# Patient Record
Sex: Male | Born: 1985 | Race: Black or African American | Hispanic: No | Marital: Married | Smoking: Never smoker
Health system: Southern US, Community
[De-identification: ages and names within clinical notes are randomized; demographics above are authoritative.]

## PROBLEM LIST (undated history)

## (undated) DIAGNOSIS — J45909 Unspecified asthma, uncomplicated: Secondary | ICD-10-CM

## (undated) HISTORY — PX: OTHER SURGICAL HISTORY: SHX169

## (undated) HISTORY — DX: Unspecified asthma, uncomplicated: J45.909

---

## 2012-07-23 ENCOUNTER — Encounter: Payer: Self-pay | Admitting: Internal Medicine

## 2012-07-27 ENCOUNTER — Ambulatory Visit (INDEPENDENT_AMBULATORY_CARE_PROVIDER_SITE_OTHER): Payer: BC Managed Care – PPO | Admitting: Internal Medicine

## 2012-07-27 ENCOUNTER — Encounter: Payer: Self-pay | Admitting: Internal Medicine

## 2012-07-27 VITALS — BP 160/60 | HR 80 | Ht 74.0 in | Wt 176.6 lb

## 2012-07-27 DIAGNOSIS — R634 Abnormal weight loss: Secondary | ICD-10-CM

## 2012-07-27 DIAGNOSIS — R143 Flatulence: Secondary | ICD-10-CM

## 2012-07-27 DIAGNOSIS — R197 Diarrhea, unspecified: Secondary | ICD-10-CM

## 2012-07-27 DIAGNOSIS — R198 Other specified symptoms and signs involving the digestive system and abdomen: Secondary | ICD-10-CM

## 2012-07-27 DIAGNOSIS — R141 Gas pain: Secondary | ICD-10-CM

## 2012-07-27 MED ORDER — METRONIDAZOLE 250 MG PO TABS: 250.0000 mg | ORAL_TABLET | Freq: Four times a day (QID) | ORAL | Status: AC

## 2012-07-27 NOTE — Progress Notes (Signed)
HISTORY OF PRESENT ILLNESS:  Isaac Morgan is a 26 y.o. male if no significant past medical history. He is sent today regarding abdominal complaints, change in bowel habits, and weight loss. Patient states that he was in his usual state of good health until November of 2012. At that time, he began to notice awakening approximately 4 to 6 AM with an upset stomach and the need to defecate. He would describe his bowels as more loose than normal, but not diarrhea. Thereafter he would have a "hollow feeling" which would linger until about noon. Remainder of the day, he felt well. Occasionally he would notice in need to move his bowels after certain meals. Typically moves his bowels once or twice daily, in the morning. He denies bleeding or steatorrhea. Since November, he reports 19 pound weight loss. He has had significant bloating. No nausea, vomiting, reflux symptoms, or urgency. Review of outside records shows laboratories from 04/16/2012 including normal comprehensive metabolic panel, lipids, thyroid studies, and CBC (except for white blood cell count of 2.5). Also, normal urinalysis. Repeat laboratories were performed 07/09/2012. Comprehensive metabolic panel and lipids were again unremarkable. CBC revealed a white blood cell count of 2.4, but was otherwise normal. Sedimentation rate, TSH, testing for celiac sprue, and Helicobacter pylori antibody were normal. He was placed on the probiotic Align last week. For the first 2 days, he states he felt better. However, symptoms have returned since. He denies change in his diet, exotic travel, over-the-counter supplements, or family history of GI problems such as inflammatory bowel disease. He works in Nurse, adult at [employer].  REVIEW OF SYSTEMS:  All non-GI ROS negative   Past Medical History  Diagnosis Date  . Asthma     For 15 years    Past Surgical History  Procedure Date  . None     Social History Mahlon Gabrielle  reports that he has  never smoked. He has never used smokeless tobacco. He reports that he does not drink alcohol or use illicit drugs.  family history includes Liver cancer in an unspecified family member.  No Known Allergies     PHYSICAL EXAMINATION: Vital signs: BP 160/60  Pulse 80  Ht 6\' 2"  (1.88 m)  Wt 176 lb 9.6 oz (80.105 kg)  BMI 22.67 kg/m2  Constitutional: generally well-appearing, no acute distress Psychiatric: alert and oriented x3, cooperative Eyes: extraocular movements intact, anicteric, conjunctiva pink Mouth: oral pharynx moist, no lesions Neck: supple no lymphadenopathy Cardiovascular: heart regular rate and rhythm, no murmur Lungs: clear to auscultation bilaterally Abdomen: soft, nontender, nondistended, no obvious ascites, no peritoneal signs, normal bowel sounds, no organomegaly Rectal:Deferred Extremities: no lower extremity edema bilaterally Skin: no lesions on visible extremities Neuro: No focal deficits. No asterixis.     ASSESSMENT:  #1. Previously healthy 26 year old with a 9 month history of change in bowel habits,and weight loss. Laboratories unrevealing. Relevance of stable leukopenia unclear. I am concerned about an underlying GI process, given the fact that the patient is awoken by symptoms and he has had weight loss. Aside from that, not a lot of additional worrisome features such as additional problems at other times of the day, bleeding, or laboratory perturbation. He could have something like Giardia.   PLAN:  #1. Empiric trial of metronidazole 250 mg by mouth 4 times a day x10 days. Effects and side effects discussed #2. Patient has been instructed to contact the office in 2-3 weeks with an update. If he is feeling better, then followup in  a few months. However, if he is no better, then we will proceed with colonoscopy and upper endoscopy with biopsies.The nature of the procedure, as well as the risks, benefits, and alternatives were carefully and thoroughly  reviewed with the patient. Ample time for discussion and questions allowed. The patient understood, was satisfied, and agreed to proceed.

## 2012-07-27 NOTE — Patient Instructions (Addendum)
We have sent the following medications to your pharmacy for you to pick up at your convenience:  Metronidazole   Please call our office in 2-3 weeks to let us know how you are doing

## 2012-11-10 ENCOUNTER — Other Ambulatory Visit: Payer: Self-pay | Admitting: Emergency Medicine

## 2012-11-10 ENCOUNTER — Ambulatory Visit
Admission: RE | Admit: 2012-11-10 | Discharge: 2012-11-10 | Disposition: A | Discharge status: Home / Self Care | Payer: BC Managed Care – PPO | Source: Ambulatory Visit | Attending: Emergency Medicine | Admitting: Emergency Medicine

## 2012-11-10 DIAGNOSIS — R0602 Shortness of breath: Secondary | ICD-10-CM

## 2012-11-17 ENCOUNTER — Telehealth: Payer: Self-pay | Admitting: Internal Medicine

## 2012-11-17 ENCOUNTER — Ambulatory Visit: Payer: BC Managed Care – PPO | Admitting: Internal Medicine

## 2012-11-17 NOTE — Telephone Encounter (Signed)
pt called to r/s appt today, something came up for work. Will you charge?

## 2012-11-24 ENCOUNTER — Encounter: Payer: Self-pay | Admitting: Internal Medicine

## 2012-11-24 ENCOUNTER — Ambulatory Visit (INDEPENDENT_AMBULATORY_CARE_PROVIDER_SITE_OTHER): Payer: BC Managed Care – PPO | Admitting: Internal Medicine

## 2012-11-24 VITALS — BP 124/62 | HR 84 | Ht 74.0 in | Wt 178.2 lb

## 2012-11-24 DIAGNOSIS — R141 Gas pain: Secondary | ICD-10-CM

## 2012-11-24 DIAGNOSIS — R143 Flatulence: Secondary | ICD-10-CM

## 2012-11-24 DIAGNOSIS — R198 Other specified symptoms and signs involving the digestive system and abdomen: Secondary | ICD-10-CM

## 2012-11-24 NOTE — Progress Notes (Signed)
HISTORY OF PRESENT ILLNESS:  Isaac Morgan is a 26 y.o. male with no significant medical history who was evaluated in August 2013 for abdominal complaints, change in bowel habits, and weight loss. See that dictation for details. He was empirically treated with metronidazole 250 mg 4 times a day for 10 days. At completing antibiotic therapy he reports complete resolution of symptoms. No further problems with nocturnal, pain, urgency, or loose stools. He now reports all of bowel movements. He has gained a few pounds of weight. Some complaint today is that of occasional gas as manifested by flatus. Does notice that there products often, but not always, seem to be associated. He requires about other dietary contributors to gas. Otherwise he feels well and is pleased  REVIEW OF SYSTEMS:  All non-GI ROS negative   Past Medical History  Diagnosis Date  . Asthma     For 15 years    Past Surgical History  Procedure Date  . None     Social History Isaac Morgan  reports that he has never smoked. He has never used smokeless tobacco. He reports that he does not drink alcohol or use illicit drugs.  family history includes Liver cancer in an unspecified family member.  No Known Allergies     PHYSICAL EXAMINATION: Vital signs: BP 124/62  Pulse 84  Ht 6\' 2"  (1.88 m)  Wt 178 lb 3.2 oz (80.831 kg)  BMI 22.88 kg/m2 General: Well-developed, well-nourished, no acute distress HEENT: Sclerae are anicteric, conjunctiva pink. Oral mucosa intact Lungs: Clear Heart: Regular Abdomen: soft, nontender, nondistended, no obvious ascites, no peritoneal signs, normal bowel sounds. No organomegaly. Extremities: No edema Psychiatric: alert and oriented x3. Cooperative    ASSESSMENT:  #1. Previous problems with abdominal complaints, change in bowel habits, and weight loss resolved after a 10 day empiric course of metronidazole. I suspect he may have had giardiasis. #2. Gas in the form of  flatus   PLAN:  #1. Anti-gas and flatulence dietary sheet as well as informational literature on intestinal gas provided for his review #2. He may try Lactaid with dairy products to see if this helps #3. GI followup as needed

## 2012-11-24 NOTE — Patient Instructions (Addendum)
You may take Lactaid, which you can find over the counter, with dairy products.  We have given you some information on gas.  Please follow up with Dr. Marina Goodell as needed

## 2012-11-29 ENCOUNTER — Encounter: Payer: Self-pay | Admitting: Pulmonary Disease

## 2012-11-29 ENCOUNTER — Ambulatory Visit (INDEPENDENT_AMBULATORY_CARE_PROVIDER_SITE_OTHER): Payer: BC Managed Care – PPO | Admitting: Pulmonary Disease

## 2012-11-29 VITALS — BP 112/78 | HR 63 | Temp 97.4°F | Ht 74.0 in | Wt 180.2 lb

## 2012-11-29 DIAGNOSIS — R0609 Other forms of dyspnea: Secondary | ICD-10-CM

## 2012-11-29 NOTE — Patient Instructions (Addendum)
Continue on symbicort as you are doing for now. Will schedule you for breathing tests to evaluate your current symptoms within the next 2 weeks.  Would like to see you back same day to review. Will have the nurse call [facility] to get all of your recent labwork.

## 2012-11-29 NOTE — Progress Notes (Signed)
  Subjective:    Patient ID: Isaac Morgan, male    DOB: 1986-08-16, 26 y.o.   MRN: [id number]  HPI The patient is a 26 year old male who I've been asked to see for dyspnea with heavy exertional activities.  The patient has a history of asthma since childhood, but has only been on maintenance therapy for the last one year.  On top of that, he is only using symbicort once a day instead of twice.  He has noticed over the last 6 months increasing shortness of breath with heavier exertional activities.  He describes this with walking up multiple flights of stairs, or playing with his dog.  The patient works out at least 3 times a week, and states that he has to take breaks in order to finish.  He denies any issues with chest tightness or wheezing during this time.  The patient has had a recent chest x-Simek that is totally clear, but he has not had followup PFTs.  He denies any cardiac issues, and does not have lower extremity edema.  He denies any cough, mucus production, or chest congestion.  He only rarely uses his rescue inhaler.  The patient states that he has had a lot of blood work recently because of a 20 pound weight loss over the last 8 months, but is unsure if he has had thyroid testing or a blood count check.   Review of Systems  Constitutional: Positive for fatigue and unexpected weight change (20lb decreased in last year...patient states that he has gained some back since lossing ). Negative for fever.  HENT: Negative for ear pain, nosebleeds, congestion, sore throat, rhinorrhea, sneezing, trouble swallowing, dental problem, postnasal drip and sinus pressure.   Eyes: Negative for redness and itching.  Respiratory: Positive for cough and shortness of breath. Negative for chest tightness and wheezing.   Cardiovascular: Negative for palpitations and leg swelling.  Gastrointestinal: Negative for nausea and vomiting.  Genitourinary: Negative for dysuria.  Musculoskeletal: Negative for joint  swelling.  Skin: Negative for rash.  Neurological: Positive for headaches.  Hematological: Does not bruise/bleed easily.  Psychiatric/Behavioral: Negative for dysphoric mood. The patient is not nervous/anxious.        Objective:   Physical Exam Constitutional:  Well developed, no acute distress  HENT:  Nares patent without discharge  Oropharynx without exudate, palate and uvula are normal  Eyes:  Perrla, eomi, no scleral icterus  Neck:  No JVD, ?soft tissue fullness around neck that may be TMG?  Cardiovascular:  Normal rate, regular rhythm, no rubs or gallops.  No murmurs        Intact distal pulses  Pulmonary :  Normal breath sounds, no stridor or respiratory distress   No rales, rhonchi, or wheezing  Abdominal:  Soft, nondistended, bowel sounds present.  No tenderness noted.   Musculoskeletal:  No lower extremity edema noted.  Lymph Nodes:  No cervical lymphadenopathy noted  Skin:  No cyanosis noted  Neurologic:  Alert, appropriate, moves all 4 extremities without obvious deficit.         Assessment & Plan:

## 2012-11-29 NOTE — Assessment & Plan Note (Signed)
The patient is noting dyspnea on exertion rarely with heavier activities over the last 6 months.  His history and lung exam really did not fit for asthma being the issue, and he has had a recent chest x-Mentel that was unremarkable.  I have reviewed the various causes of shortness of breath, including pulmonary issues, cardiac issues, weight/conditioning, and also miscellaneous issues such as thyroid disease or anemia.  He does have soft tissue fullness around his neck, but he is unsure if he has had thyroid testing.  Given his weight loss and new symptoms, I think this needs to be done if it hasn't already.  I would like the patient to continue on his current asthma regimen, and we'll schedule for full pulmonary function studies.  If this is unremarkable, I would consider an echocardiogram for completeness.

## 2012-12-21 ENCOUNTER — Encounter: Payer: Self-pay | Admitting: Pulmonary Disease

## 2012-12-21 ENCOUNTER — Ambulatory Visit (INDEPENDENT_AMBULATORY_CARE_PROVIDER_SITE_OTHER): Payer: BC Managed Care – PPO | Admitting: Pulmonary Disease

## 2012-12-21 VITALS — BP 102/70 | HR 74 | Temp 97.8°F | Ht 74.0 in | Wt 175.0 lb

## 2012-12-21 DIAGNOSIS — R0609 Other forms of dyspnea: Secondary | ICD-10-CM

## 2012-12-21 DIAGNOSIS — R0989 Other specified symptoms and signs involving the circulatory and respiratory systems: Secondary | ICD-10-CM

## 2012-12-21 LAB — PULMONARY FUNCTION TEST

## 2012-12-21 NOTE — Assessment & Plan Note (Signed)
The patient has been found to have a normal chest x-Firestine, clear lung fields, as well as totally normal pulmonary function studies.  I can find nothing from a pulmonary standpoint to explain his dyspnea on exertion.  Obviously, his asthma is well controlled on his current regimen with no airflow obstruction.  I have also reviewed recent lab work that shows no evidence for thyroid disease or anemia.  At this point, I have discussed with him the possibility of a cardiac workup, but he is 27 years old with no symptoms.  If he wishes to continue workup for his symptoms, I think a cardiopulmonary exercise test would be a better choice.  The patient is agreeable to this approach.

## 2012-12-21 NOTE — Progress Notes (Signed)
PFT done today. 

## 2012-12-21 NOTE — Progress Notes (Signed)
  Subjective:    Patient ID: Isaac Morgan, male    DOB: [birth year], 27 y.o.   MRN: [id number]  HPI The patient comes in today for followup of his pulmonary function studies, done as part of a workup for dyspnea on exertion.  The patient has a long history of asthma, and at the last visit I told him to increase his symbicort 2 twice a day dosing which is considered therapeutic.  Since doing this, he feels his breathing is a little bit better.  His PFTs today showed no airflow obstruction, no restriction, and a normal diffusion capacity.  I have also reviewed blood work that shows no acute abnormality.   Review of Systems  Constitutional: Negative for fever and unexpected weight change.  HENT: Negative for ear pain, nosebleeds, congestion, sore throat, rhinorrhea, sneezing, trouble swallowing, dental problem, postnasal drip and sinus pressure.   Eyes: Negative for redness and itching.  Respiratory: Negative for cough, chest tightness, shortness of breath and wheezing.   Cardiovascular: Negative for palpitations and leg swelling.  Gastrointestinal: Negative for nausea and vomiting.  Genitourinary: Negative for dysuria.  Musculoskeletal: Negative for joint swelling.  Skin: Negative for rash.  Neurological: Positive for headaches.  Hematological: Does not bruise/bleed easily.  Psychiatric/Behavioral: Negative for dysphoric mood. The patient is nervous/anxious.        Objective:   Physical Exam Thin male in no acute distress Nose without purulence or discharge noted Neck without lymphadenopathy or thyromegaly Chest is totally clear Cardiac exam is regular rate and rhythm Lower extremities without edema, cyanosis Alert and oriented, moves all 4 extremities.       Assessment & Plan:

## 2012-12-21 NOTE — Patient Instructions (Addendum)
Stay on symbicort twice a day, keep mouth rinsed well after using. Will schedule for cardiopulmonary exercise test to further evaluate your shortness of breath.  Will call you with results.

## 2012-12-30 NOTE — Addendum Note (Signed)
Addended by: Reynaldo Minium C on: 12/30/2012 02:50 PM   Modules accepted: Orders

## 2013-01-04 ENCOUNTER — Ambulatory Visit (HOSPITAL_COMMUNITY): Payer: BC Managed Care – PPO | Attending: Pulmonary Disease

## 2013-01-04 DIAGNOSIS — R0989 Other specified symptoms and signs involving the circulatory and respiratory systems: Secondary | ICD-10-CM | POA: Insufficient documentation

## 2013-01-04 DIAGNOSIS — R0609 Other forms of dyspnea: Secondary | ICD-10-CM | POA: Insufficient documentation

## 2013-01-05 DIAGNOSIS — R0602 Shortness of breath: Secondary | ICD-10-CM

## 2013-01-10 ENCOUNTER — Encounter: Payer: Self-pay | Admitting: Pulmonary Disease

## 2013-01-11 ENCOUNTER — Other Ambulatory Visit: Payer: Self-pay | Admitting: Pulmonary Disease

## 2013-01-27 ENCOUNTER — Ambulatory Visit (HOSPITAL_COMMUNITY)
Admission: RE | Admit: 2013-01-27 | Discharge: 2013-01-27 | Disposition: A | Discharge status: Home / Self Care | Payer: BC Managed Care – PPO | Source: Ambulatory Visit | Attending: Internal Medicine | Admitting: Internal Medicine

## 2013-01-27 VITALS — BP 112/62 | HR 75 | Wt 176.2 lb

## 2013-01-27 DIAGNOSIS — R0609 Other forms of dyspnea: Secondary | ICD-10-CM | POA: Insufficient documentation

## 2013-01-27 DIAGNOSIS — R0989 Other specified symptoms and signs involving the circulatory and respiratory systems: Secondary | ICD-10-CM | POA: Insufficient documentation

## 2013-01-27 NOTE — Assessment & Plan Note (Signed)
I have reviewed exercise study. Although his ventilatory slope is normal, his pVO2, VAT and O2 pulse are abnormal and certainly raise the question of a possible cardiac limitation which seems to go along with his symptoms. I think the next step is to proceed with an echocardiogram. If that is normal I might consider cardiac CT of cMRI to fully evaluate.

## 2013-01-27 NOTE — Patient Instructions (Addendum)
Your physician has requested that you have an echocardiogram. Echocardiography is a painless test that uses sound waves to create images of your heart. It provides your doctor with information about the size and shape of your heart and how well your heart's chambers and valves are working. This procedure takes approximately one hour. There are no restrictions for this procedure.    Your physician recommends that you schedule a follow-up appointment in: 1 month  

## 2013-01-27 NOTE — Addendum Note (Signed)
Encounter addended by: Noralee Space, RN on: 01/27/2013 12:36 PM<BR>     Documentation filed: Patient Instructions Section, Orders

## 2013-01-27 NOTE — Progress Notes (Signed)
Referring Physician: Dr. Shelle Iron  Reason for referral: Dyspnea. Abnormal CPX  HPI:  Isaac Morgan is a 27 y/o male with h/o childhood asthma but no other medical problems.  Denies any h/o known heart disease.   Saw Dr. Shelle Iron recently for 6-8 month h/o progressive exertional dyspnea. Felt it was related to his asthma. Had PFTs which were normal. Then referred for CPX testing which suggested cardiac limitation.   Resting HR: 63 Peak HR: 166 % age predicted max HR: 86% BP rest: 131/73 BP peak: 179/74 Peak VO2: 23.8 % predicted peak VO2: 58% VE/VCO2 slope: 28.9 OUES: 1.94 Peak RER: 1.25 Ventilatory Threshold: 15.9 % predicted peak VO2: 38.7% Peak RR 30 Peak Ventilation: 71.3 VE/MVV: 35.1% PETCO2 at peak: 36 O2pulse: 12 % predicted O2pulse: 71% (with abrupt flattening)   Over past 6 months had lost 25 pounds and had GI symptoms. Saw Dr. Marina Goodell and treated with Flagyl and symptoms resolved.  Exercises 2-3x weeks with push-ups, sit-ups and exercise bands. No aerobic exercise. Tried to start biking but had to stop because he got tired very quickly. Feels his exercise capacity is not what it should be. No orthopnea, PND, or lower extremity edema. In high school, played basketball without problem.  Has had blood work including thyroid panel which was normal. Notes a little bit of CP when he gets really SOB but this has happened for a long time. No syncope.   Works with IT at [employer]. Denies FHx of premature CAD or SCD.    Review of Systems: [y] = yes, [ ]  = no   General: Weight gain [ ] ; Weight loss [place] ]; Anorexia [ ] ; Fatigue [ ] ; Fever [ ] ; Chills [ ] ; Weakness [ ]   Cardiac: Chest pain/pressure [ ] ; Resting SOB [ ] ; Exertional SOB [ y]; Orthopnea [ ] ; Pedal Edema [ ] ; Palpitations [ ] ; Syncope [ ] ; Presyncope [ ] ; Paroxysmal nocturnal dyspnea[ ]   Pulmonary: Cough [ ] ; Wheezing[ ] ; Hemoptysis[ ] ; Sputum [ ] ; Snoring [ ]   GI: Vomiting[ ] ; Dysphagia[ ] ; Melena[ ] ; Hematochezia [ ] ; Heartburn[ ] ;  Abdominal pain [ ] ; Constipation [ ] ; Diarrhea [ ] ; BRBPR [ ]   GU: Hematuria[ ] ; Dysuria [ ] ; Nocturia[ ]   Vascular: Pain in legs with walking [ ] ; Pain in feet with lying flat [ ] ; Non-healing sores [ ] ; Stroke [ ] ; TIA [ ] ; Slurred speech [ ] ;  Neuro: Headaches[ ] ; Vertigo[ ] ; Seizures[ ] ; Paresthesias[ ] ;Blurred vision [ ] ; Diplopia [ ] ; Vision changes [ ]   Ortho/Skin: Arthritis [ ] ; Joint pain [ ] ; Muscle pain [ ] ; Joint swelling [ ] ; Back Pain [ ] ; Rash [ ]   Psych: Depression[ ] ; Anxiety[ ]   Heme: Bleeding problems [ ] ; Clotting disorders [ ] ; Anemia [ ]   Endocrine: Diabetes [ ] ; Thyroid dysfunction[ ]    Past Medical History  Diagnosis Date  . Asthma     For 15 years    Current Outpatient Prescriptions  Medication Sig Dispense Refill  . aspirin 325 MG tablet Take 325 mg by mouth daily as needed. For leg pain.      . budesonide-formoterol (SYMBICORT) 160-4.5 MCG/ACT inhaler Inhale 2 puffs into the lungs daily.       No current facility-administered medications for this encounter.    No Known Allergies  History   Social History  . Marital Status: Married    Spouse Name: N/A    Number of Children: N/A  . Years of Education: N/A   Occupational History  .  Manager     Social History Main Topics  . Smoking status: Never Smoker   . Smokeless tobacco: Never Used  . Alcohol Use: Yes     Comment: occassional/social  . Drug Use: No  . Sexually Active: Not on file   Other Topics Concern  . Not on file   Social History Narrative  . No narrative on file    Family History  Problem Relation Age of Onset  . Liver cancer      ?    PHYSICAL EXAM: Filed Vitals:   01/27/13 1129  BP: 112/62  Pulse: 75  Weight: 176 lb 4 oz (79.946 kg)  SpO2: 99%    General:  Tall thin Well appearing. No respiratory difficulty HEENT: normal Neck: supple. no JVD. Carotids 2+ bilat; no bruits. No lymphadenopathy or thryomegaly appreciated. Cor: PMI nondisplaced. Regular rate & rhythm.  No rubs or murmurs. +s4 Lungs: clear Abdomen: soft, nontender, nondistended. No hepatosplenomegaly. No bruits or masses. Good bowel sounds. Extremities: no cyanosis, clubbing, rash, edema Neuro: alert & oriented x 3, cranial nerves grossly intact. moves all 4 extremities w/o difficulty. Affect pleasant.  ECG: 01/04/13 SR 64 No ST-T wave abnormalities. Normal axis and intervals.     ASSESSMENT & PLAN:

## 2013-01-31 ENCOUNTER — Ambulatory Visit (HOSPITAL_COMMUNITY)
Admission: RE | Admit: 2013-01-31 | Discharge: 2013-01-31 | Disposition: A | Discharge status: Home / Self Care | Payer: BC Managed Care – PPO | Source: Ambulatory Visit | Attending: Internal Medicine | Admitting: Internal Medicine

## 2013-01-31 DIAGNOSIS — R0602 Shortness of breath: Secondary | ICD-10-CM

## 2013-01-31 DIAGNOSIS — R0609 Other forms of dyspnea: Secondary | ICD-10-CM | POA: Insufficient documentation

## 2013-01-31 DIAGNOSIS — R0989 Other specified symptoms and signs involving the circulatory and respiratory systems: Secondary | ICD-10-CM | POA: Insufficient documentation

## 2013-01-31 NOTE — Progress Notes (Signed)
  Echocardiogram 2D Echocardiogram has been performed.  Isaac Morgan 01/31/2013, 2:15 PM

## 2013-02-17 ENCOUNTER — Ambulatory Visit (HOSPITAL_COMMUNITY)
Admission: RE | Admit: 2013-02-17 | Discharge: 2013-02-17 | Disposition: A | Discharge status: Home / Self Care | Payer: BC Managed Care – PPO | Source: Ambulatory Visit | Attending: Internal Medicine | Admitting: Internal Medicine

## 2013-02-17 ENCOUNTER — Encounter (HOSPITAL_COMMUNITY): Payer: Self-pay

## 2013-02-17 VITALS — BP 108/68 | HR 73

## 2013-02-17 DIAGNOSIS — R0609 Other forms of dyspnea: Secondary | ICD-10-CM | POA: Insufficient documentation

## 2013-02-17 DIAGNOSIS — R0989 Other specified symptoms and signs involving the circulatory and respiratory systems: Secondary | ICD-10-CM

## 2013-02-17 LAB — COMPREHENSIVE METABOLIC PANEL
ALT: 28 U/L (ref 0–53)
AST: 19 U/L (ref 0–37)
Albumin: 4.3 g/dL (ref 3.5–5.2)
Alkaline Phosphatase: 66 U/L (ref 39–117)
Glucose, Bld: 81 mg/dL (ref 70–99)
Potassium: 4.3 mEq/L (ref 3.5–5.1)
Sodium: 139 mEq/L (ref 135–145)
Total Protein: 8.2 g/dL (ref 6.0–8.3)

## 2013-02-17 LAB — CBC WITH DIFFERENTIAL/PLATELET
Basophils Relative: 0 % (ref 0–1)
Eosinophils Absolute: 0.1 10*3/uL (ref 0.0–0.7)
Lymphs Abs: 1.7 10*3/uL (ref 0.7–4.0)
MCH: 30.7 pg (ref 26.0–34.0)
Neutrophils Relative %: 43 % (ref 43–77)
Platelets: 285 10*3/uL (ref 150–400)
RBC: 4.63 MIL/uL (ref 4.22–5.81)

## 2013-02-17 NOTE — Progress Notes (Signed)
Patient ID: Isaac Morgan, male   DOB: [birth year], 27 y.o.   MRN: [id number]  Referring Physician: Dr. Shelle Iron  Reason for referral: Dyspnea. Abnormal CPX  HPI:  Isaac Morgan is a 28 y/o male with h/o childhood asthma but no other medical problems.  Denies any h/o known heart disease.   Saw Dr. Shelle Iron recently for 6-8 month h/o progressive exertional dyspnea. Felt it was related to his asthma. Had PFTs which were normal. Then referred for CPX testing which suggested cardiac limitation.   Resting HR: 63 Peak HR: 166 bpm age predicted max HR: 86% BP rest: 131/73 BP peak: 179/74 Peak VO2: 23.8 ml/kg/min  predicted peak VO2: 58% VE/VCO2 slope: 28.9 OUES: 1.94 Peak RER: 1.25 Ventilatory Threshold: 15.9 ml/kg/min predicted peak VO2: 38.7% Peak RR 30 Peak Ventilation: 71.3 VE/MVV: 35.1% PETCO2 at peak: 36 O2pulse: 12 % predicted O2pulse: 71% (with abrupt flattening)   His stomach issues have resolved. Has lost about 15 pounds. Since we last saw him had echo which was totally normal. Has been laying low and not exercising much lately. Says when he does try to exercise he gets winded a lot quicker than he used to. Denies muscle weakness.    PHYSICAL EXAM: Filed Vitals:   02/17/13 1542  BP: 108/68  Pulse: 73  SpO2: 99%    General:  Tall thin Well appearing. No respiratory difficulty HEENT: normal Neck: supple. no JVD. Carotids 2+ bilat; no bruits. No lymphadenopathy or thryomegaly appreciated. Cor: PMI nondisplaced. Regular rate & rhythm. No rubs or murmurs. +s4 Lungs: clear Abdomen: soft, nontender, nondistended. No hepatosplenomegaly. No bruits or masses. Good bowel sounds. Extremities: no cyanosis, clubbing, rash, edema Neuro: alert & oriented x 3, cranial nerves grossly intact. moves all 4 extremities w/o difficulty. Full strength in all muscle groups.  Affect pleasant.    ASSESSMENT & PLAN:

## 2013-02-17 NOTE — Patient Instructions (Addendum)
Labs today  Your physician recommends that you schedule a follow-up appointment in: 1 month  

## 2013-02-17 NOTE — Assessment & Plan Note (Signed)
I have reviewed his echo images personally with him and his wife. His echo is completely normal. I have also reviewed his CPX test with him and Laymond Purser (our exercise physiologist). He has a significant decrease in his pVO2 with early flattening of his O2 pulse concerning for a cardiac limitation but his Ve/VCO2 slope and OUES are normal. We discussed the fact that this may be due to deconditioning but this is more of a decrease than I would expect. Other possibilities would be ischemia or anemia. Currently my overall suspicion for a major cardiac issue is quite low but I feel we need to pay close attention. We talked about doing a stress echo or chest CT however we have decided to take a more gradual approach. We will draw labs today and I gave him a very specific exercise program. He will try to do this program for 4 weeks - if not improving we will proceed with other testing. If exercise capacity improving we will repeat CPX and see if his numbers improve.   Exercise program. Workout 4-5 days/week  Week 1. Warm-up: 5 min walk. 4 sets: 1 min run/45min walk. Cool down: 5 min walk Week 2. Warm-up: 5 min walk. 4 sets: 2 min run/84min walk. Cool down: 5 min walk Week 3. Warm-up: 5 min walk. 4 sets: 3 min run/74min walk. Cool down: 5 min walk Week 4. Warm-up: 5 min walk. 4 sets: 5 min run/64min walk. Cool down: 5 min walk

## 2013-03-28 ENCOUNTER — Ambulatory Visit (HOSPITAL_COMMUNITY): Payer: BC Managed Care – PPO | Attending: Internal Medicine

## 2015-07-19 ENCOUNTER — Ambulatory Visit (INDEPENDENT_AMBULATORY_CARE_PROVIDER_SITE_OTHER): Payer: BLUE CROSS/BLUE SHIELD

## 2015-07-19 ENCOUNTER — Ambulatory Visit (INDEPENDENT_AMBULATORY_CARE_PROVIDER_SITE_OTHER): Payer: BLUE CROSS/BLUE SHIELD | Admitting: Podiatry

## 2015-07-19 ENCOUNTER — Encounter: Payer: Self-pay | Admitting: Podiatry

## 2015-07-19 VITALS — BP 132/70 | HR 64 | Resp 12

## 2015-07-19 DIAGNOSIS — M722 Plantar fascial fibromatosis: Secondary | ICD-10-CM

## 2015-07-19 DIAGNOSIS — M79673 Pain in unspecified foot: Secondary | ICD-10-CM | POA: Diagnosis not present

## 2015-07-19 DIAGNOSIS — Q828 Other specified congenital malformations of skin: Secondary | ICD-10-CM

## 2015-07-19 NOTE — Progress Notes (Signed)
   Subjective:    Patient ID: Isaac Morgan, male    DOB: 12-20-1985, 29 y.o.   MRN: [id number]  HPI  PT STATED B/L BALL OF THE FEET HAVE HARD SKIN AND PAINFUL FOR 2 MONTHS. FEET ARE GETTING WORSE WHEN PUTTING PRESSURE ON IT. TRIED NO TREATMENT. He states that he doesn't typically wear tennis shoes to work versus home he wears dress shoes and he is to do a lot of walking.  Review of Systems  All other systems reviewed and are negative.      Objective:   Physical Exam: 29 year old black male in apparent good nutritional status. Presents vital signs stable alert and oriented 3. Pulses are strongly palpable bilateral. Capillary fill time to digits 1 through 5 was immediate. Neurologic sensorium is intact per Semmes-Weinstein monofilament. Deep tendon reflexes are intact bilateral and muscle strength +5 over 5 dorsiflexion plantar flexors and inverters everters all intrinsic musculature is intact. Orthopedic evaluation demonstrates slim narrow elongated feet with an elongated second metatarsal on radiographs demonstrating a plantarflexed elongated metatarsal resulting in a solitary porokeratotic lesion plantar aspect of the second metatarsophalangeal joint bilaterally. These lesions do not appear to be clinically infected. Radiographs taken today in the office consisting of 3 views bilateral foot demonstrating no major osseous abnormalities other than elongated second metatarsals. Orthopedic evaluation also does demonstrate gastroc equinus bilateral.        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  Plan: Discussed etiology pathology conservative versus surgical therapies. He was scanned today for set of orthotics. Debrided reactive hyperkeratotic lesions 2 plantar aspect of the second metatarsal phalangeal joint  bilateral. No iatrogenic lesions noted. Discussed appropriate shoe gear stretching exercises ice therapy and shoe gear modifications. I will follow up with him once his orthotics have been fabricated.

## 2015-08-17 ENCOUNTER — Ambulatory Visit: Payer: BLUE CROSS/BLUE SHIELD | Admitting: *Deleted

## 2015-08-17 DIAGNOSIS — M722 Plantar fascial fibromatosis: Secondary | ICD-10-CM

## 2015-08-17 NOTE — Patient Instructions (Signed)

## 2015-08-17 NOTE — Progress Notes (Signed)
Patient ID: Isaac Morgan, male   DOB: [birth year], 29 y.o.   MRN: [id number] Patient presents for orthotic pick up.  Verbal and written break in and wear instructions given.  Patient will follow up in 4 weeks if symptoms worsen or fail to improve.

## 2015-11-21 ENCOUNTER — Other Ambulatory Visit (HOSPITAL_COMMUNITY): Payer: Self-pay | Admitting: Family Medicine

## 2015-11-21 ENCOUNTER — Ambulatory Visit
Admission: RE | Admit: 2015-11-21 | Discharge: 2015-11-21 | Disposition: A | Discharge status: Home / Self Care | Payer: BLUE CROSS/BLUE SHIELD | Source: Ambulatory Visit | Attending: Family Medicine | Admitting: Family Medicine

## 2015-11-21 DIAGNOSIS — R0602 Shortness of breath: Secondary | ICD-10-CM

## 2015-11-21 DIAGNOSIS — R059 Cough, unspecified: Secondary | ICD-10-CM

## 2015-11-21 DIAGNOSIS — R05 Cough: Secondary | ICD-10-CM

## 2016-08-26 IMAGING — CR DG CHEST 2V
2 series · 2 of 2 positions shown · non-contrast
Comparison: Chest x-ray dated Saturday November, 2012

CLINICAL DATA: One-month history of cough, sinus congestion, and
shortness of breath, history of asthma, nonsmoker.

EXAM:
CHEST  2 VIEW

[w chest pa]
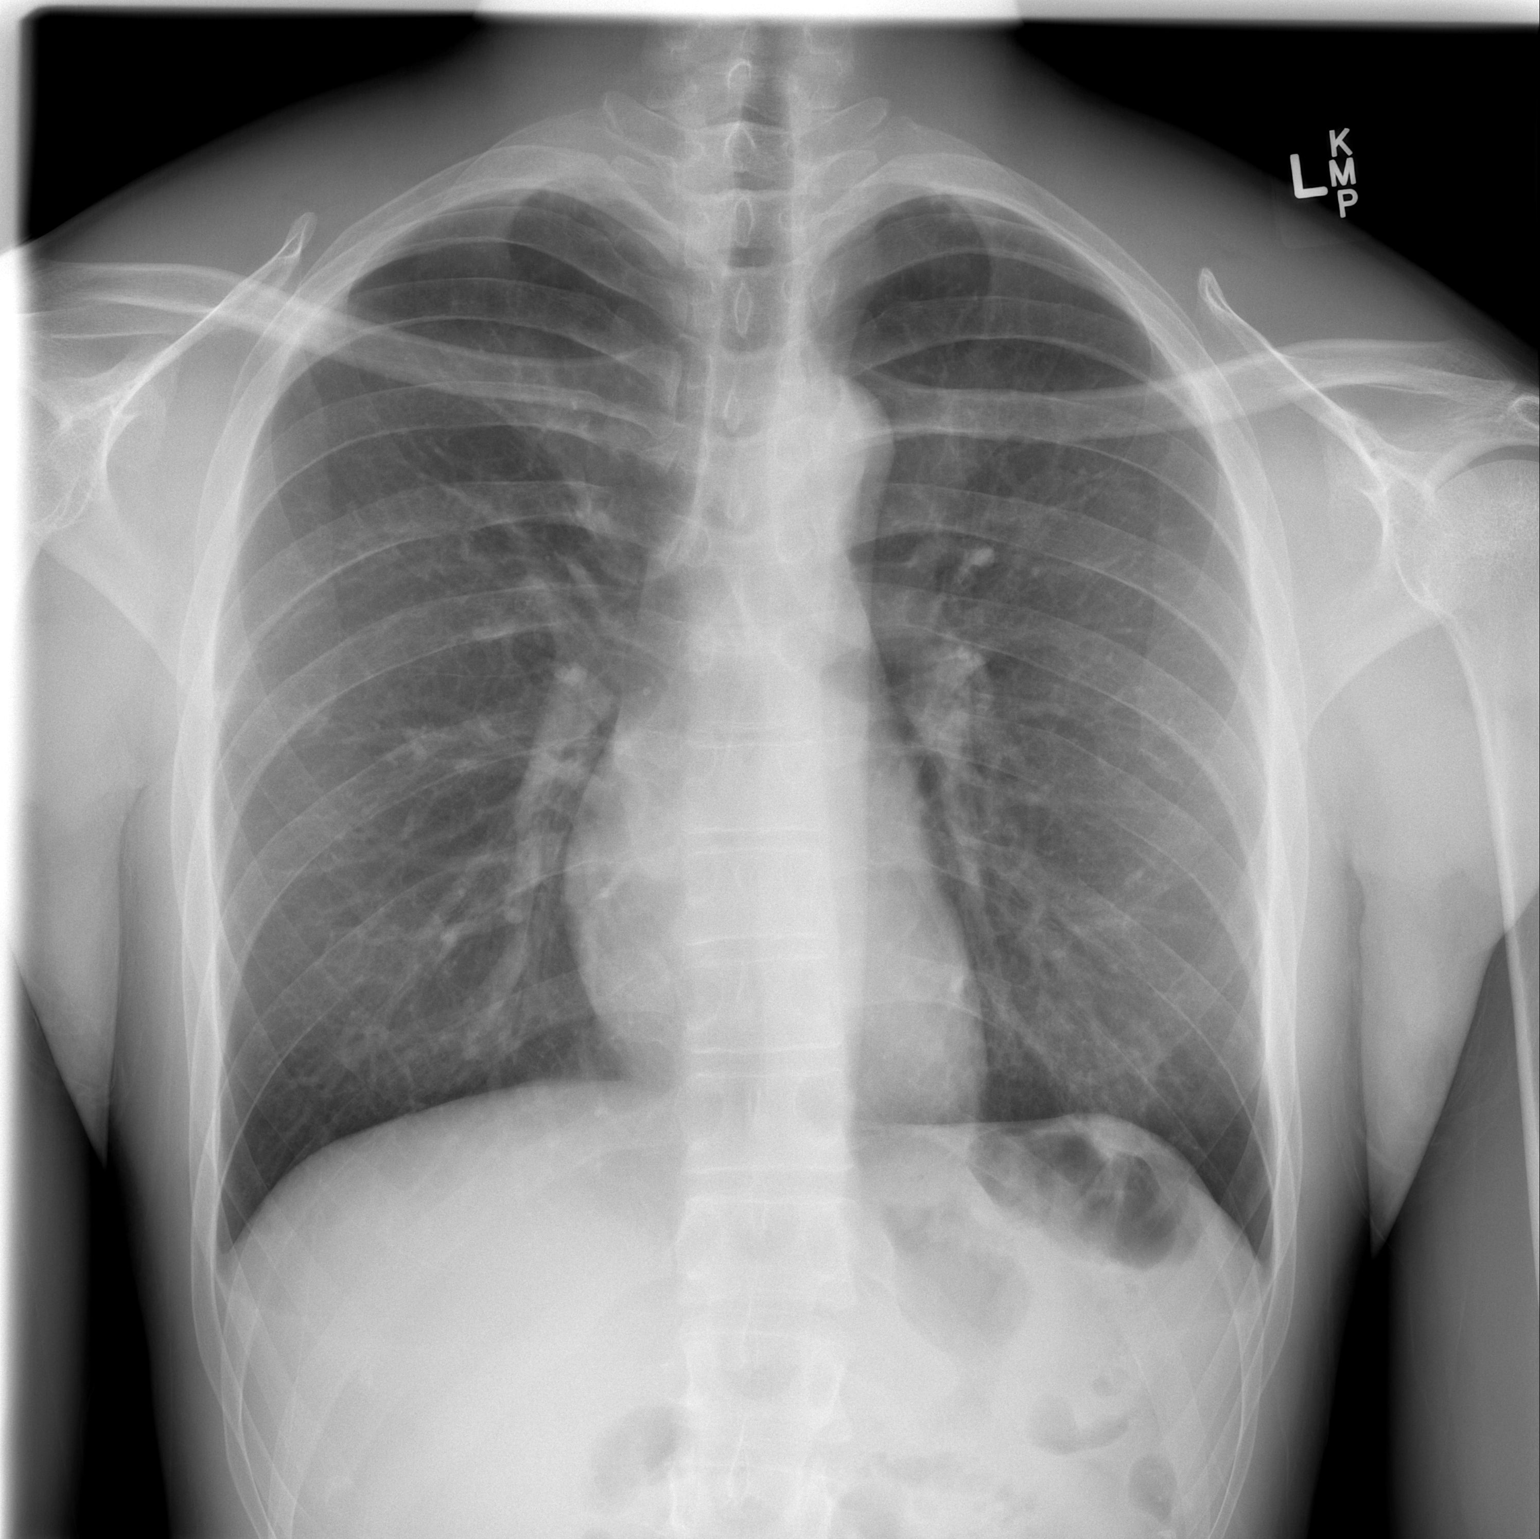

[w chest lat]
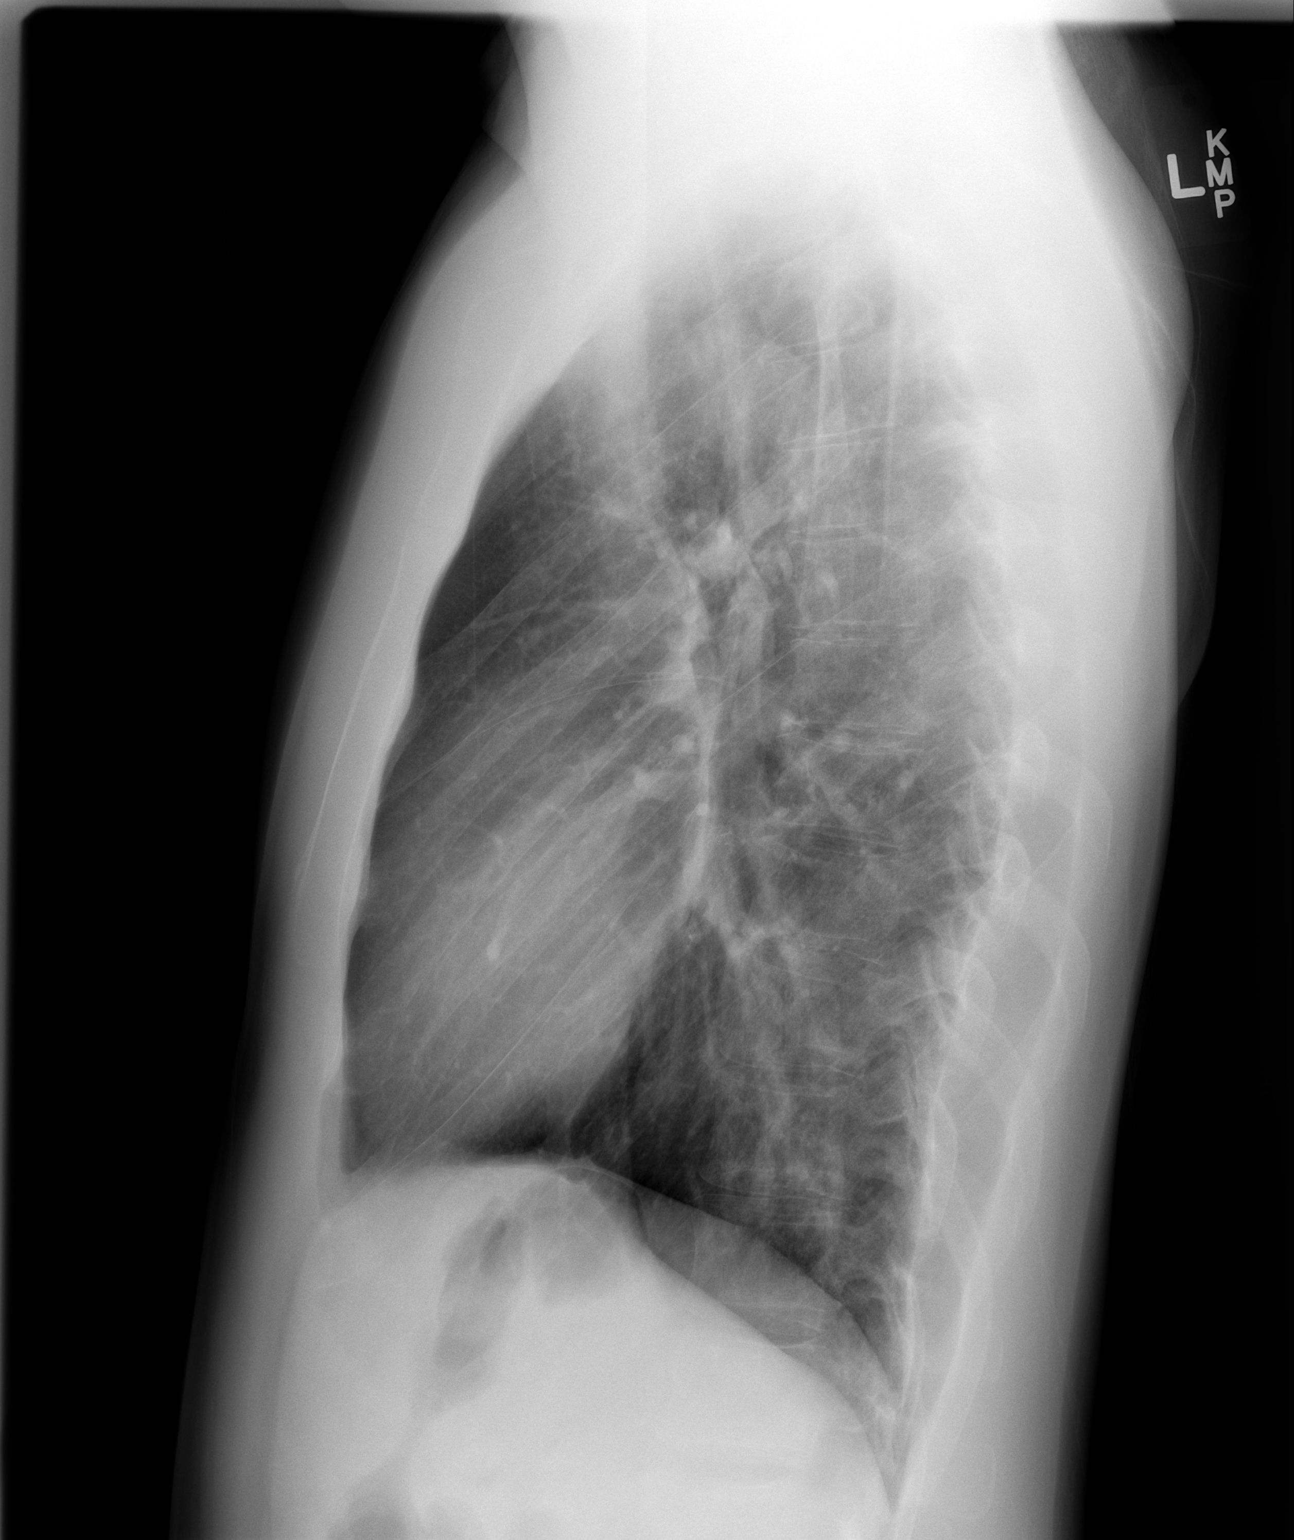

[2 of 2 positions shown; findings below may reference images not displayed]

FINDINGS: Chest x-ray: The lungs are adequately inflated. There are coarse
interstitial lung markings in the left lower lobe. There is no
alveolar infiltrate. There is no pleural effusion. The heart and
pulmonary vascularity are normal. The trachea is midline. The bony
thorax is unremarkable.

Three-view sinus series: The frontal sinuses are well pneumatized
and clear. There is mild mucoperiosteal thickening and a small
amount of fluid in the left maxillary sinus. The ethmoid and
sphenoid sinuses are clear. The nasal septum is midline.
IMPRESSION: 1. Left lower lobe subsegmental atelectasis or early interstitial
pneumonia. Follow-up radiographs following anticipated antibiotic
therapy are recommended unless the patient's symptoms completely
clear.
2. Acute left maxillary sinusitis with a small air-fluid level.
